# Patient Record
Sex: Female | Born: 1970 | Race: White | Hispanic: No | Marital: Married | Smoking: Former smoker
Health system: Southern US, Community
[De-identification: ages and names within clinical notes are randomized; demographics above are authoritative.]

## PROBLEM LIST (undated history)

## (undated) HISTORY — PX: BREAST SURGERY: SHX581

## (undated) HISTORY — PX: BREAST REDUCTION SURGERY: SHX8

## (undated) HISTORY — PX: OVARIAN CYST SURGERY: SHX726

---

## 2006-05-17 ENCOUNTER — Other Ambulatory Visit: Admission: RE | Admit: 2006-05-17 | Discharge: 2006-05-17 | Payer: Self-pay | Admitting: Obstetrics and Gynecology

## 2007-07-14 ENCOUNTER — Encounter: Payer: Self-pay | Admitting: Endocrinology

## 2007-07-14 ENCOUNTER — Ambulatory Visit: Payer: Self-pay | Admitting: Endocrinology

## 2007-07-14 DIAGNOSIS — N83209 Unspecified ovarian cyst, unspecified side: Secondary | ICD-10-CM | POA: Insufficient documentation

## 2007-07-14 DIAGNOSIS — Z9189 Other specified personal risk factors, not elsewhere classified: Secondary | ICD-10-CM | POA: Insufficient documentation

## 2007-07-14 DIAGNOSIS — H8080 Other otosclerosis, unspecified ear: Secondary | ICD-10-CM | POA: Insufficient documentation

## 2007-12-14 ENCOUNTER — Telehealth (INDEPENDENT_AMBULATORY_CARE_PROVIDER_SITE_OTHER): Payer: Self-pay | Admitting: *Deleted

## 2007-12-14 DIAGNOSIS — E559 Vitamin D deficiency, unspecified: Secondary | ICD-10-CM | POA: Insufficient documentation

## 2007-12-15 ENCOUNTER — Ambulatory Visit: Payer: Self-pay | Admitting: Endocrinology

## 2007-12-25 ENCOUNTER — Telehealth (INDEPENDENT_AMBULATORY_CARE_PROVIDER_SITE_OTHER): Payer: Self-pay | Admitting: *Deleted

## 2007-12-25 LAB — CONVERTED CEMR LAB: Vit D, 1,25-Dihydroxy: 23 — ABNORMAL LOW (ref 30–89)

## 2007-12-27 ENCOUNTER — Ambulatory Visit: Payer: Self-pay | Admitting: Endocrinology

## 2007-12-28 LAB — CONVERTED CEMR LAB
Calcium, Total (PTH): 9.7 mg/dL (ref 8.4–10.5)
PTH: 45.2 pg/mL (ref 14.0–72.0)

## 2009-09-30 ENCOUNTER — Encounter: Admission: RE | Admit: 2009-09-30 | Discharge: 2009-09-30 | Payer: Self-pay | Admitting: Family Medicine

## 2009-09-30 IMAGING — CR DG HAND COMPLETE 3+V*R*
2 series · 2 of 2 positions shown · non-contrast
Comparison: Right third finger series from the same day.

CLINICAL DATA: 38-year-old female with pain in the right third
digit following injury 6 weeks ago.  MCP joint pain and swelling.

RIGHT HAND - COMPLETE 3+ VIEW

[view not recorded (1 of 2)]
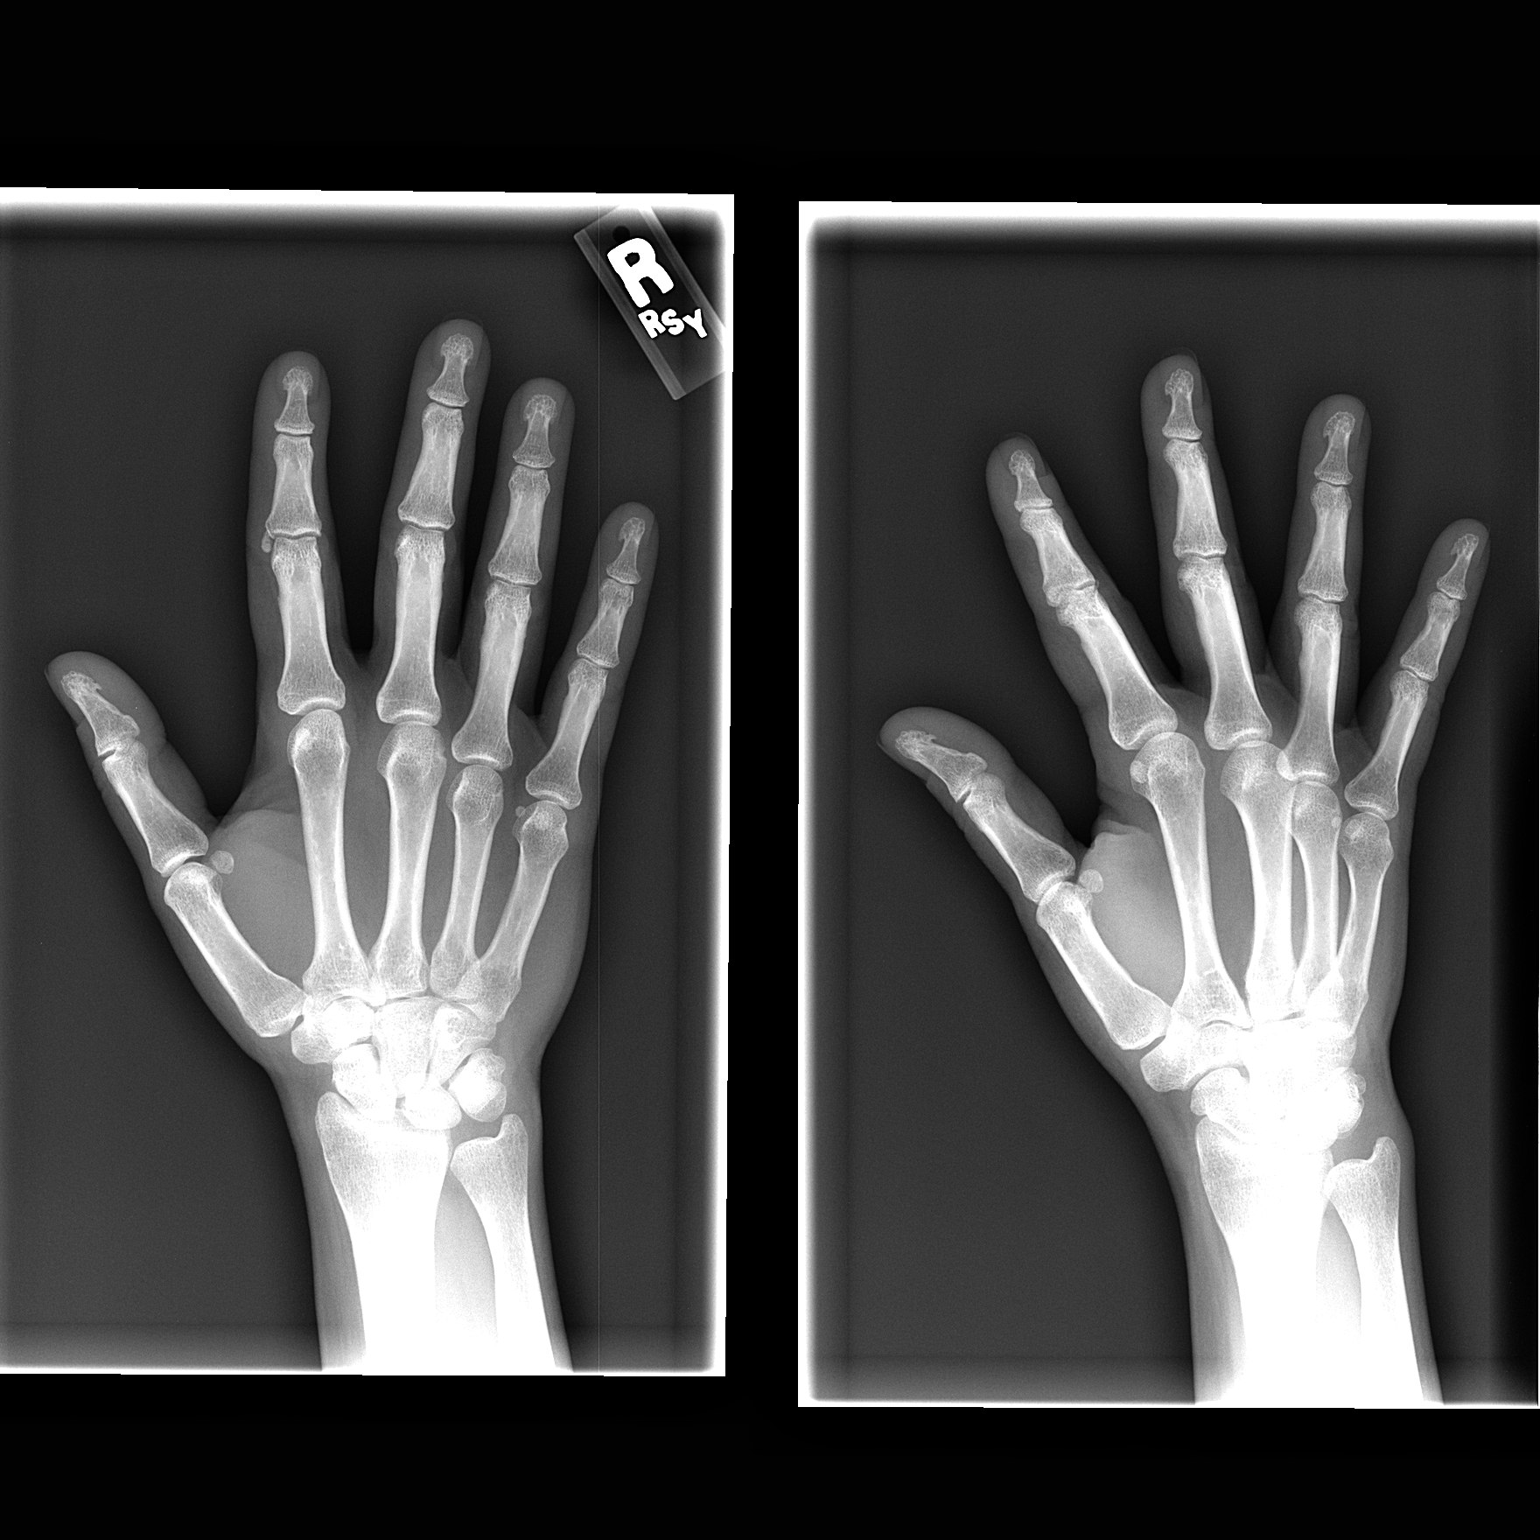

[view not recorded (2 of 2)]
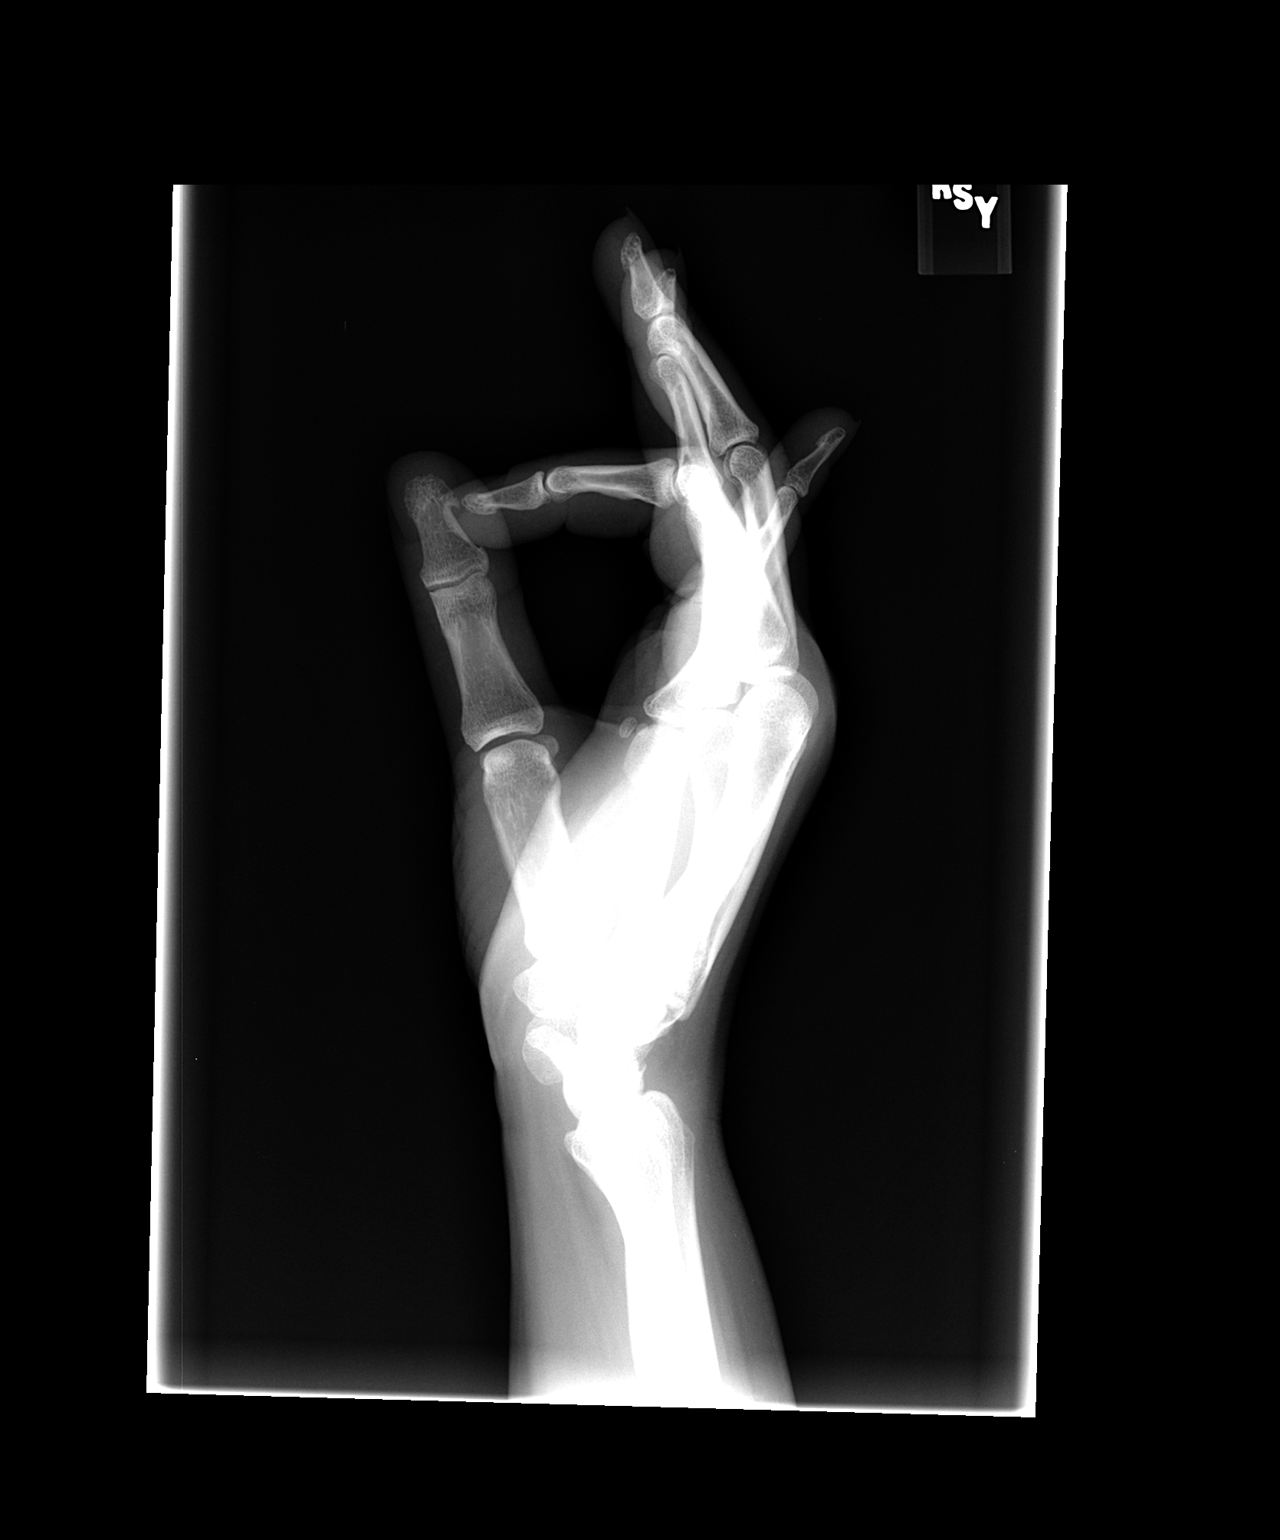

[2 of 2 positions shown; findings below may reference images not displayed]

FINDINGS: Bone mineralization is within normal limits. Distal right
radius and ulna are intact.  Right carpal bone alignment is within
normal limits.  Joint spaces are within normal limits.  Calcific
density along the radial aspect of the right second PIP joint could
reflect a sesamoid bone or the sequelae of prior trauma.  MCP joint
including the third MCP joint appear within normal limits.  There
does appear to be dorsal soft tissue swelling over the MCP joint on
the lateral view.  No fracture or dislocation identified.
IMPRESSION: 1.  Dorsal soft tissue swelling at the level of the MCP joint.
2. No acute osseous abnormality identified in the right hand.

## 2010-06-03 ENCOUNTER — Encounter: Admission: RE | Admit: 2010-06-03 | Discharge: 2010-06-03 | Payer: Self-pay | Admitting: Family Medicine

## 2011-06-11 ENCOUNTER — Other Ambulatory Visit: Payer: Self-pay | Admitting: Obstetrics and Gynecology

## 2011-06-11 DIAGNOSIS — Z1231 Encounter for screening mammogram for malignant neoplasm of breast: Secondary | ICD-10-CM

## 2011-07-02 ENCOUNTER — Ambulatory Visit: Payer: Self-pay

## 2011-07-23 ENCOUNTER — Ambulatory Visit: Payer: Self-pay

## 2011-08-04 ENCOUNTER — Ambulatory Visit
Admission: RE | Admit: 2011-08-04 | Discharge: 2011-08-04 | Disposition: A | Discharge status: Home / Self Care | Source: Ambulatory Visit | Attending: Obstetrics and Gynecology | Admitting: Obstetrics and Gynecology

## 2011-08-04 DIAGNOSIS — Z1231 Encounter for screening mammogram for malignant neoplasm of breast: Secondary | ICD-10-CM

## 2012-11-03 ENCOUNTER — Other Ambulatory Visit: Payer: Self-pay | Admitting: Obstetrics and Gynecology

## 2012-11-03 DIAGNOSIS — Z1231 Encounter for screening mammogram for malignant neoplasm of breast: Secondary | ICD-10-CM

## 2012-11-30 ENCOUNTER — Ambulatory Visit
Admission: RE | Admit: 2012-11-30 | Discharge: 2012-11-30 | Disposition: A | Discharge status: Home / Self Care | Source: Ambulatory Visit | Attending: Obstetrics and Gynecology | Admitting: Obstetrics and Gynecology

## 2012-11-30 DIAGNOSIS — Z1231 Encounter for screening mammogram for malignant neoplasm of breast: Secondary | ICD-10-CM

## 2013-01-16 ENCOUNTER — Ambulatory Visit (INDEPENDENT_AMBULATORY_CARE_PROVIDER_SITE_OTHER): Admitting: Internal Medicine

## 2013-01-16 ENCOUNTER — Encounter: Payer: Self-pay | Admitting: Internal Medicine

## 2013-01-16 VITALS — BP 118/70 | HR 79 | Temp 97.8°F | Resp 18 | Ht 64.0 in | Wt 152.0 lb

## 2013-01-16 DIAGNOSIS — E559 Vitamin D deficiency, unspecified: Secondary | ICD-10-CM

## 2013-01-16 DIAGNOSIS — Z862 Personal history of diseases of the blood and blood-forming organs and certain disorders involving the immune mechanism: Secondary | ICD-10-CM

## 2013-01-16 DIAGNOSIS — N83209 Unspecified ovarian cyst, unspecified side: Secondary | ICD-10-CM

## 2013-01-16 NOTE — Progress Notes (Signed)
  Subjective:    Patient ID: Alicia George, female    DOB: 04/10/1971, 42 y.o.   MRN: [id number]  HPI Alicia George is here as a new pt to establish prmary care.    PMH of bilateral ovarian cysts,  Vitamin D deficiency,  Mild anemia in past and L shoulder injury.    L shoulder  She is managed by Dr. Charlett Blake and had cortisone injection a few weeks ago.    Alicia George reports she started a vegan diet last August.  She reports she was deficient in Vitamin D and took supplements for 8 weeks but did not go back for a recheck level.  She has had mild anemia and is wondering about her B12 level.  She is a strict vegan and has not had any animal products.     No Known Allergies History reviewed. No pertinent past medical history. Past Surgical History  Procedure Laterality Date  . Ovarian cyst surgery    . Breast surgery    . Breast reduction surgery     History   Social History  . Marital Status: Married    Spouse Name: N/A    Number of Children: N/A  . Years of Education: N/A   Occupational History  . Not on file.   Social History Main Topics  . Smoking status: Not on file  . Smokeless tobacco: Never Used  . Alcohol Use: No  . Drug Use: No  . Sexually Active: Yes    Birth Control/ Protection: Surgical   Other Topics Concern  . Not on file   Social History Narrative  . No narrative on file   Family History  Problem Relation Age of Onset  . Cancer Father 58    prostate  . Depression Daughter   . Alzheimer's disease Maternal Grandmother   . Stroke Maternal Grandfather   . Heart disease Paternal Grandmother   . Hypertension Paternal Grandmother   . Heart disease Paternal Grandfather   . Hypertension Paternal Grandfather    Patient Active Problem List  Diagnosis  . VITAMIN D DEFICIENCY  . OTOSCLEROSIS NEC  . OVARIAN CYST  . REDUCTION MAMMOPLASTY, HX OF   No current outpatient prescriptions on file prior to visit.   No current facility-administered medications on file prior to  visit.       Review of Systems See HPI    Objective:   Physical Exam Physical Exam  Nursing note and vitals reviewed.  Constitutional: She is oriented to person, place, and time. She appears well-developed and well-nourished.  HENT:  Head: Normocephalic and atraumatic.  Cardiovascular: Normal rate and regular rhythm. Exam reveals no gallop and no friction rub.  No murmur heard.  Pulmonary/Chest: Breath sounds normal. She has no wheezes. She has no rales.  Neurological: She is alert and oriented to person, place, and time.  Skin: Skin is warm and dry.  Psychiatric: She has a normal mood and affect. Her behavior is normal.              Assessment & Plan:  Vitamin D deficiency  Will check today along with chemistries and     HIstory of anemia/Vegan diet  Will check  B12 today with CBC and thyroid  HIstory of bilateral ovarian cysts  Will get GYN records  L shoulder injury   Managed Dr. Charlett Blake

## 2013-01-16 NOTE — Patient Instructions (Addendum)
Schedule CPE

## 2013-01-29 LAB — LIPID PANEL
HDL: 64 mg/dL (ref >39)
LDL Cholesterol: 67 mg/dL (ref 0–99)
Triglycerides: 49 mg/dL (ref <150)
VLDL: 10 mg/dL (ref 0–40)

## 2013-01-29 LAB — CBC WITH DIFFERENTIAL/PLATELET
Basophils Absolute: 0 10*3/uL (ref 0.0–0.1)
Basophils Relative: 0 % (ref 0–1)
Eosinophils Absolute: 0 10*3/uL (ref 0.0–0.7)
HCT: 37.4 % (ref 36.0–46.0)
Hemoglobin: 12.3 g/dL (ref 12.0–15.0)
MCH: 29.4 pg (ref 26.0–34.0)
MCHC: 32.9 g/dL (ref 30.0–36.0)
Neutrophils Relative %: 50 % (ref 43–77)

## 2013-01-29 LAB — COMPREHENSIVE METABOLIC PANEL
ALT: 18 U/L (ref 0–35)
AST: 18 U/L (ref 0–37)
Alkaline Phosphatase: 46 U/L (ref 39–117)
Glucose, Bld: 84 mg/dL (ref 70–99)
Potassium: 4.8 mEq/L (ref 3.5–5.3)
Sodium: 140 mEq/L (ref 135–145)

## 2013-01-30 LAB — VITAMIN B12: Vitamin B-12: 357 pg/mL (ref 211–911)

## 2013-01-31 ENCOUNTER — Other Ambulatory Visit: Payer: Self-pay | Admitting: Internal Medicine

## 2013-01-31 ENCOUNTER — Telehealth: Payer: Self-pay | Admitting: *Deleted

## 2013-01-31 MED ORDER — ERGOCALCIFEROL 1.25 MG (50000 UT) PO CAPS: 50000.0000 [IU] | ORAL_CAPSULE | ORAL | Status: DC

## 2013-02-07 NOTE — Telephone Encounter (Signed)
Refill

## 2013-02-28 ENCOUNTER — Telehealth: Payer: Self-pay | Admitting: *Deleted

## 2013-02-28 NOTE — Telephone Encounter (Signed)
Notified [name] of Alicia George's need for repeated labs

## 2013-03-12 ENCOUNTER — Telehealth: Payer: Self-pay | Admitting: *Deleted

## 2013-05-01 ENCOUNTER — Other Ambulatory Visit (HOSPITAL_COMMUNITY)
Admission: RE | Admit: 2013-05-01 | Discharge: 2013-05-01 | Disposition: A | Discharge status: Home / Self Care | Source: Ambulatory Visit | Attending: Internal Medicine | Admitting: Internal Medicine

## 2013-05-01 ENCOUNTER — Encounter: Payer: Self-pay | Admitting: Internal Medicine

## 2013-05-01 ENCOUNTER — Ambulatory Visit (INDEPENDENT_AMBULATORY_CARE_PROVIDER_SITE_OTHER): Admitting: Internal Medicine

## 2013-05-01 VITALS — BP 110/70 | HR 89 | Temp 99.0°F | Resp 16 | Ht 64.0 in | Wt 152.0 lb

## 2013-05-01 DIAGNOSIS — E559 Vitamin D deficiency, unspecified: Secondary | ICD-10-CM

## 2013-05-01 DIAGNOSIS — Z789 Other specified health status: Secondary | ICD-10-CM

## 2013-05-01 DIAGNOSIS — Z124 Encounter for screening for malignant neoplasm of cervix: Secondary | ICD-10-CM

## 2013-05-01 DIAGNOSIS — Z1151 Encounter for screening for human papillomavirus (HPV): Secondary | ICD-10-CM | POA: Insufficient documentation

## 2013-05-01 DIAGNOSIS — N83209 Unspecified ovarian cyst, unspecified side: Secondary | ICD-10-CM

## 2013-05-01 DIAGNOSIS — Z01419 Encounter for gynecological examination (general) (routine) without abnormal findings: Secondary | ICD-10-CM | POA: Insufficient documentation

## 2013-05-01 DIAGNOSIS — N909 Noninflammatory disorder of vulva and perineum, unspecified: Secondary | ICD-10-CM

## 2013-05-01 DIAGNOSIS — Z0000 Encounter for general adult medical examination without abnormal findings: Secondary | ICD-10-CM

## 2013-05-01 LAB — POCT URINALYSIS DIPSTICK
Glucose, UA: NEGATIVE
Ketones, UA: NEGATIVE
Spec Grav, UA: 1.01

## 2013-05-01 NOTE — Patient Instructions (Addendum)
Call dermatologist  For appointment  Dr. Danella Deis, Dr. Emily Filbert ,  Dr. Sharyn Lull  Will set up appt with GYN

## 2013-05-01 NOTE — Progress Notes (Signed)
Subjective:    Patient ID: Alicia George, female    DOB: [birth year], 42 y.o.   MRN: [id number]  HPI Alicia George is here for CPE  Overall doing well.  She has finished her 50,000 units of vitamin D  And would like to know how much to take now.  Vitamin D deficiency  See above  She tells me she feels a nodule near her rectal area that has been present for about 4 months.  No rectal bleeding or change in color of stool  No diarrhea.  No history of current partner having genital warts.  She also has a longstanding skin lesion on her Left upper leg near her knee and L lower calf.  She has seen a dermatolgist about this in the past and told it was nothing to worry about  No Known Allergies No past medical history on file. Past Surgical History  Procedure Laterality Date  . Ovarian cyst surgery    . Breast surgery    . Breast reduction surgery     History   Social History  . Marital Status: Married    Spouse Name: N/A    Number of Children: N/A  . Years of Education: N/A   Occupational History  . Not on file.   Social History Main Topics  . Smoking status: Former Smoker    Quit date: 01/17/2003  . Smokeless tobacco: Never Used  . Alcohol Use: No  . Drug Use: No  . Sexually Active: Yes    Birth Control/ Protection: Surgical   Other Topics Concern  . Not on file   Social History Narrative  . No narrative on file   Family History  Problem Relation Age of Onset  . Cancer Father 16    prostate  . Depression Daughter   . Alzheimer's disease Maternal Grandmother   . Stroke Maternal Grandfather   . Heart disease Paternal Grandmother   . Hypertension Paternal Grandmother   . Heart disease Paternal Grandfather   . Hypertension Paternal Grandfather    Patient Active Problem List   Diagnosis Date Noted  . History of anemia 01/16/2013  . VITAMIN D DEFICIENCY 12/14/2007  . OTOSCLEROSIS NEC 07/14/2007  . OVARIAN CYST 07/14/2007  . REDUCTION MAMMOPLASTY, HX OF 07/14/2007    Current Outpatient Prescriptions on File Prior to Visit  Medication Sig Dispense Refill  . ergocalciferol (VITAMIN D2) 50000 UNITS capsule Take 1 capsule (50,000 Units total) by mouth once a week.  8 capsule  0   No current facility-administered medications on file prior to visit.       Review of Systems  All other systems reviewed and are negative.       Objective:   Physical Exam Physical Exam  Vital signs and nursing note reviewed  Constitutional: She is oriented to person, place, and time. She appears well-developed and well-nourished. She is cooperative.  HENT:  Head: Normocephalic and atraumatic.  Right Ear: Tympanic membrane normal.  Left Ear: Tympanic membrane normal.  Nose: Nose normal.  Mouth/Throat: Oropharynx is clear and moist and mucous membranes are normal. No oropharyngeal exudate or posterior oropharyngeal erythema.  Eyes: Conjunctivae and EOM are normal. Pupils are equal, round, and reactive to light.  Neck: Neck supple. No JVD present. Carotid bruit is not present. No mass and no thyromegaly present.  Cardiovascular: Regular rhythm, normal heart sounds, intact distal pulses and normal pulses.  Exam reveals no gallop and no friction rub.   No murmur heard. Pulses:  Dorsalis pedis pulses are 2+ on the right side, and 2+ on the left side.  Pulmonary/Chest: Breath sounds normal. She has no wheezes. She has no rhonchi. She has no rales. Right breast exhibits no mass, no nipple discharge and no skin change. Left breast exhibits no mass, no nipple discharge and no skin change.   Well healed surgical scars from breast reduction Abdominal: Soft. Bowel sounds are normal. She exhibits no distension and no mass. There is no hepatosplenomegaly. There is no tenderness. There is no CVA tenderness.  Genitourinary: Rectum normal, vagina normal and uterus normal. Rectal exam shows no mass. Guaiac negative stool. No labial fusion. There is no lesion on the right labia.  There is no lesion on the left labia. Cervix exhibits no motion tenderness. Right adnexum displays no mass, no tenderness and no fullness. Perinuem  Shows  Small nodular lesion near rectum.   Left adnexum displays no mass, no tenderness and no fullness. No erythema around the vagina.  Musculoskeletal:       No active synovitis to any joint.    Lymphadenopathy:       Right cervical: No superficial cervical adenopathy present.      Left cervical: No superficial cervical adenopathy present.       Right axillary: No pectoral and no lateral adenopathy present.       Left axillary: No pectoral and no lateral adenopathy present.      Right: No inguinal adenopathy present.       Left: No inguinal adenopathy present.  Neurological: She is alert and oriented to person, place, and time. She has normal strength and normal reflexes. No cranial nerve deficit or sensory deficit. She displays a negative Romberg sign. Coordination and gait normal.  Skin: Skin is warm and dry. No abrasion, no bruising, no ecchymosis and no rash noted. No cyanosis. Nails show no clubbing.  Psychiatric: She has a normal mood and affect. Her speech is normal and behavior is normal.          Assessment & Plan:  Health maintenance   She is to check her records regarding Tdap  UTD with mm         Assessment & Plan:  Health Maintenance  Vitamin D deficiency  Will need to recheck at some point.  Insurance did not cover her last blood test.  Ok to take 09-1998 unit daily  Perineal lesions  Will refer to GYN for further eval    History of ovarian cyst  Skin lesion on L leg  Gave pt number to Dr. Emily Filbert dermatology .  Pt states she will call

## 2013-05-03 ENCOUNTER — Encounter: Payer: Self-pay | Admitting: *Deleted

## 2013-05-15 NOTE — Telephone Encounter (Signed)
Error

## 2013-08-02 ENCOUNTER — Other Ambulatory Visit: Payer: Self-pay

## 2013-11-09 ENCOUNTER — Telehealth: Payer: Self-pay | Admitting: *Deleted

## 2013-11-09 NOTE — Telephone Encounter (Signed)
Notified pt that we did receive her daughters paperwork

## 2014-07-12 ENCOUNTER — Other Ambulatory Visit: Payer: Self-pay

## 2014-07-29 ENCOUNTER — Encounter: Payer: Self-pay | Admitting: Internal Medicine
